# Patient Record
Sex: Male | Born: 1965 | Race: White | Hispanic: No | Marital: Married | Smoking: Never smoker
Health system: Southern US, Community
[De-identification: ages and names within clinical notes are randomized; demographics above are authoritative.]

---

## 2007-02-28 ENCOUNTER — Encounter: Admission: RE | Admit: 2007-02-28 | Discharge: 2007-02-28 | Payer: Self-pay | Admitting: Cardiology

## 2009-04-02 ENCOUNTER — Emergency Department (HOSPITAL_COMMUNITY): Admission: EM | Admit: 2009-04-02 | Discharge: 2009-04-02 | Payer: Self-pay | Admitting: Emergency Medicine

## 2010-07-17 LAB — POCT URINALYSIS DIP (DEVICE)
Nitrite: NEGATIVE
Protein, ur: NEGATIVE mg/dL
Specific Gravity, Urine: 1.015 (ref 1.005–1.030)
Urobilinogen, UA: 1 mg/dL (ref 0.0–1.0)
pH: 7 (ref 5.0–8.0)

## 2011-03-21 ENCOUNTER — Encounter: Payer: Self-pay | Admitting: Sports Medicine

## 2011-03-21 ENCOUNTER — Ambulatory Visit (INDEPENDENT_AMBULATORY_CARE_PROVIDER_SITE_OTHER): Payer: Self-pay | Admitting: Sports Medicine

## 2011-03-21 VITALS — BP 112/73 | HR 97 | Ht 69.0 in | Wt 160.0 lb

## 2011-03-21 DIAGNOSIS — M7139 Other bursal cyst, multiple sites: Secondary | ICD-10-CM

## 2011-03-21 DIAGNOSIS — M6749 Ganglion, multiple sites: Secondary | ICD-10-CM

## 2011-03-21 DIAGNOSIS — M8560 Other cyst of bone, unspecified site: Secondary | ICD-10-CM

## 2011-03-21 NOTE — Patient Instructions (Signed)
Please test the arch strap for the cyst on the bottom of your foot  Stop by tomorrow and we will fit you for the sleeve for the cyst on your ankle.  Please let us know how the arch strap works for your foot, and we can make adjustments if needed.  Thank you for seeing [place] today!

## 2011-03-21 NOTE — Progress Notes (Signed)
  Subjective:    Patient ID: Robert Shannon, male    DOB: [birth year], 45 y.o.   MRN: [id number]  HPI  Pt presents to clinic for evaluation of a painful cyst on the bottom of his rt foot that has been painful for about 1 month.  Was becoming painful to walk and run.  Has reduced running over the past month due to cyst.  Saw a podiatrist who did u/s who gave options of surgical removing or suctioning the fluid out of the cyst.  Pt is a triathlete.  He comes for another opinion.  The left anterior shin he has had a bone cyst just above his ankle. This not painful except when he skis. Ski boot pressure is very painful over this cyst and he wonders if there something we could do to relieve that.  Review of Systems     Objective:   Physical Exam  No Acute distress  Right foot is neutral in shape with a moderate arch The foot is not tender in any area except for a small half centimeter nodule This is located in the forefoot proximal to the third metatarsal head This is for removal Nontender to squeeze or movement but tender to pressure when standing  Left distal tibia shows a hard nodular area about 2 cm in with on the anterior shaft just above the tibial talar articulation This is not movable      Assessment & Plan:

## 2011-03-21 NOTE — Assessment & Plan Note (Signed)
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 He has had this evaluated in the past.  We plan to make him an ankle compression sleeve with a doughnut cut out to see if we can take pressure off this when he tries to ski

## 2011-03-21 NOTE — Assessment & Plan Note (Signed)
We used an arch strap with a foam rubber doughnut pad  This took pressure off the cyst with standing and walking  He will use this over the next 6 weeks and see if the cyst will spontaneously resolve

## 2016-05-17 DIAGNOSIS — M9905 Segmental and somatic dysfunction of pelvic region: Secondary | ICD-10-CM | POA: Diagnosis not present

## 2016-06-08 ENCOUNTER — Ambulatory Visit (INDEPENDENT_AMBULATORY_CARE_PROVIDER_SITE_OTHER): Payer: 59 | Admitting: Family Medicine

## 2016-06-08 ENCOUNTER — Encounter: Payer: Self-pay | Admitting: Family Medicine

## 2016-06-08 ENCOUNTER — Encounter (INDEPENDENT_AMBULATORY_CARE_PROVIDER_SITE_OTHER): Payer: Self-pay

## 2016-06-08 DIAGNOSIS — R002 Palpitations: Secondary | ICD-10-CM | POA: Diagnosis not present

## 2016-06-08 DIAGNOSIS — M6788 Other specified disorders of synovium and tendon, other site: Secondary | ICD-10-CM

## 2016-06-08 DIAGNOSIS — M7660 Achilles tendinitis, unspecified leg: Secondary | ICD-10-CM | POA: Diagnosis not present

## 2016-06-08 MED ORDER — NITROGLYCERIN 0.2 MG/HR TD PT24: MEDICATED_PATCH | TRANSDERMAL | 1 refills | Status: DC

## 2016-06-08 NOTE — Progress Notes (Signed)
  Robert CooleyDaniel H Shannon - 51 y.o. male MRN [id number]  Date of birth: May 14, 1965    SUBJECTIVE:      Chief Complaint:/ HPI:   Right achilles pain for last month. Started training a little more aggressively. No specific injury. Pain with running....got to the point he skipped his run earlier in week. Has some pain with walking. No weakness  #2. Concerned about his heart rate. At last race he checked and his [employer] watch said his HR was 178. Earlier this week the watch indicated his HR topped out at 190. He felt fine both times.His watch does not have a chest belt (wrist monitor only), He denies any SOB, no exercise intolerance (and has been training  Regularly. No LE edema. No PND. No episodes  Of heart fluttering.   ROS:     no weight change, no fever. See HPI  PERTINENT  PMH / PSH FH / / SH:  Past Medical, Surgical, Social, and Family History Reviewed & Updated in the EMR.  Pertinent findings include:  No DM Nonsmoker No cardiac history  OBJECTIVE: BP 124/77   Pulse (!) 58   Ht 5\' 9"  (1.753 m)   Wt 160 lb (72.6 kg)   BMI 23.63 kg/m   Physical Exam:  Vital signs are reviewed. WD WN NAD FEET: high arch (cavus). Right achilles is not tender to palpation or stretch. No defect noted. Eccentric stretch is somewhat painful. GAIT: running gait mid foot striker but ion right foot he still has some lateral heel strike with over pronation to land on midfoot at mid stance phase. Otherwise he has pretty neutral gait. ANKLE: FROM, no effusion, no pain with movement. Shoe exam: right shoe some increased wear lateral portion of heel. VASC DP 2+B=. SKIN of feet: no callous formation or unusual wear pattern.  NEURO: soft touch sensation intact B feet CV RRR no murmur. Cartids without bruit  EKG Sinus bradycardia. Athletes heart. No ST-T changes, no sign of arrhythmia.  ASSESSMENT & PLAN:  See problem based charting & AVS for pt instructions.

## 2016-06-08 NOTE — Patient Instructions (Signed)
Nitroglycerin Protocol   Apply 1/4 nitroglycerin patch to affected area daily.  Change position of patch within the affected area every 24 hours.  You may experience a headache during the first 1-2 weeks of using the patch, these should subside.  If you experience headaches after beginning nitroglycerin patch treatment, you may take your preferred over the counter pain reliever.  Another side effect of the nitroglycerin patch is skin irritation or rash related to patch adhesive.  Please notify our office if you develop more severe headaches or rash, and stop the patch.  Tendon healing with nitroglycerin patch may require 12 to 24 weeks depending on the extent of injury.  Men should not use if taking Viagra, Cialis, or Levitra.   Do not use if you have migraines or rosacea.    Follow up in 6 weeks. 

## 2016-06-11 ENCOUNTER — Encounter: Payer: Self-pay | Admitting: Sports Medicine

## 2016-06-21 DIAGNOSIS — M7661 Achilles tendinitis, right leg: Secondary | ICD-10-CM | POA: Diagnosis not present

## 2016-07-18 DIAGNOSIS — M7661 Achilles tendinitis, right leg: Secondary | ICD-10-CM | POA: Diagnosis not present

## 2016-08-14 DIAGNOSIS — M7661 Achilles tendinitis, right leg: Secondary | ICD-10-CM | POA: Diagnosis not present

## 2016-08-21 DIAGNOSIS — R262 Difficulty in walking, not elsewhere classified: Secondary | ICD-10-CM | POA: Diagnosis not present

## 2016-08-21 DIAGNOSIS — M7661 Achilles tendinitis, right leg: Secondary | ICD-10-CM | POA: Diagnosis not present

## 2016-08-21 DIAGNOSIS — M25571 Pain in right ankle and joints of right foot: Secondary | ICD-10-CM | POA: Diagnosis not present

## 2016-08-23 DIAGNOSIS — M7661 Achilles tendinitis, right leg: Secondary | ICD-10-CM | POA: Diagnosis not present

## 2016-08-23 DIAGNOSIS — R262 Difficulty in walking, not elsewhere classified: Secondary | ICD-10-CM | POA: Diagnosis not present

## 2016-08-23 DIAGNOSIS — M25571 Pain in right ankle and joints of right foot: Secondary | ICD-10-CM | POA: Diagnosis not present

## 2016-08-27 DIAGNOSIS — M25571 Pain in right ankle and joints of right foot: Secondary | ICD-10-CM | POA: Diagnosis not present

## 2016-08-27 DIAGNOSIS — R262 Difficulty in walking, not elsewhere classified: Secondary | ICD-10-CM | POA: Diagnosis not present

## 2016-08-27 DIAGNOSIS — M7661 Achilles tendinitis, right leg: Secondary | ICD-10-CM | POA: Diagnosis not present

## 2016-08-30 DIAGNOSIS — M25571 Pain in right ankle and joints of right foot: Secondary | ICD-10-CM | POA: Diagnosis not present

## 2016-08-30 DIAGNOSIS — R262 Difficulty in walking, not elsewhere classified: Secondary | ICD-10-CM | POA: Diagnosis not present

## 2016-08-30 DIAGNOSIS — M7661 Achilles tendinitis, right leg: Secondary | ICD-10-CM | POA: Diagnosis not present

## 2016-09-03 DIAGNOSIS — M25571 Pain in right ankle and joints of right foot: Secondary | ICD-10-CM | POA: Diagnosis not present

## 2016-09-03 DIAGNOSIS — R262 Difficulty in walking, not elsewhere classified: Secondary | ICD-10-CM | POA: Diagnosis not present

## 2016-09-03 DIAGNOSIS — M7661 Achilles tendinitis, right leg: Secondary | ICD-10-CM | POA: Diagnosis not present

## 2016-09-06 DIAGNOSIS — M7661 Achilles tendinitis, right leg: Secondary | ICD-10-CM | POA: Diagnosis not present

## 2016-09-06 DIAGNOSIS — R262 Difficulty in walking, not elsewhere classified: Secondary | ICD-10-CM | POA: Diagnosis not present

## 2016-09-06 DIAGNOSIS — M25571 Pain in right ankle and joints of right foot: Secondary | ICD-10-CM | POA: Diagnosis not present

## 2016-09-12 DIAGNOSIS — M25571 Pain in right ankle and joints of right foot: Secondary | ICD-10-CM | POA: Diagnosis not present

## 2016-09-12 DIAGNOSIS — R262 Difficulty in walking, not elsewhere classified: Secondary | ICD-10-CM | POA: Diagnosis not present

## 2016-09-12 DIAGNOSIS — M7661 Achilles tendinitis, right leg: Secondary | ICD-10-CM | POA: Diagnosis not present

## 2016-09-13 DIAGNOSIS — M9905 Segmental and somatic dysfunction of pelvic region: Secondary | ICD-10-CM | POA: Diagnosis not present

## 2016-09-13 DIAGNOSIS — M7661 Achilles tendinitis, right leg: Secondary | ICD-10-CM | POA: Diagnosis not present

## 2016-09-13 DIAGNOSIS — M9906 Segmental and somatic dysfunction of lower extremity: Secondary | ICD-10-CM | POA: Diagnosis not present

## 2016-09-17 DIAGNOSIS — M25571 Pain in right ankle and joints of right foot: Secondary | ICD-10-CM | POA: Diagnosis not present

## 2016-09-17 DIAGNOSIS — R262 Difficulty in walking, not elsewhere classified: Secondary | ICD-10-CM | POA: Diagnosis not present

## 2016-09-17 DIAGNOSIS — M7661 Achilles tendinitis, right leg: Secondary | ICD-10-CM | POA: Diagnosis not present

## 2016-09-18 DIAGNOSIS — M7661 Achilles tendinitis, right leg: Secondary | ICD-10-CM | POA: Diagnosis not present

## 2016-09-18 DIAGNOSIS — M9905 Segmental and somatic dysfunction of pelvic region: Secondary | ICD-10-CM | POA: Diagnosis not present

## 2016-09-18 DIAGNOSIS — M9906 Segmental and somatic dysfunction of lower extremity: Secondary | ICD-10-CM | POA: Diagnosis not present

## 2016-09-19 DIAGNOSIS — R262 Difficulty in walking, not elsewhere classified: Secondary | ICD-10-CM | POA: Diagnosis not present

## 2016-09-19 DIAGNOSIS — M7661 Achilles tendinitis, right leg: Secondary | ICD-10-CM | POA: Diagnosis not present

## 2016-09-19 DIAGNOSIS — M25571 Pain in right ankle and joints of right foot: Secondary | ICD-10-CM | POA: Diagnosis not present

## 2016-09-25 DIAGNOSIS — R262 Difficulty in walking, not elsewhere classified: Secondary | ICD-10-CM | POA: Diagnosis not present

## 2016-09-25 DIAGNOSIS — M7661 Achilles tendinitis, right leg: Secondary | ICD-10-CM | POA: Diagnosis not present

## 2016-09-25 DIAGNOSIS — M25571 Pain in right ankle and joints of right foot: Secondary | ICD-10-CM | POA: Diagnosis not present

## 2016-09-28 DIAGNOSIS — M9906 Segmental and somatic dysfunction of lower extremity: Secondary | ICD-10-CM | POA: Diagnosis not present

## 2016-09-28 DIAGNOSIS — M7661 Achilles tendinitis, right leg: Secondary | ICD-10-CM | POA: Diagnosis not present

## 2016-09-28 DIAGNOSIS — M9905 Segmental and somatic dysfunction of pelvic region: Secondary | ICD-10-CM | POA: Diagnosis not present

## 2016-10-18 ENCOUNTER — Ambulatory Visit (INDEPENDENT_AMBULATORY_CARE_PROVIDER_SITE_OTHER): Payer: 59 | Admitting: Sports Medicine

## 2016-10-18 ENCOUNTER — Encounter: Payer: Self-pay | Admitting: Sports Medicine

## 2016-10-18 ENCOUNTER — Ambulatory Visit: Payer: Self-pay

## 2016-10-18 ENCOUNTER — Encounter (INDEPENDENT_AMBULATORY_CARE_PROVIDER_SITE_OTHER): Payer: Self-pay

## 2016-10-18 VITALS — BP 100/64 | Ht 69.0 in | Wt 163.0 lb

## 2016-10-18 DIAGNOSIS — M7660 Achilles tendinitis, unspecified leg: Secondary | ICD-10-CM

## 2016-10-18 DIAGNOSIS — M6788 Other specified disorders of synovium and tendon, other site: Secondary | ICD-10-CM

## 2016-10-18 NOTE — Progress Notes (Signed)
CC: Achilles pain RT  Seen for AT and tried NTG in past Some pain relief but pain persisted Saw Dr Farris HasKramer and XR showed some calcification at insertion  OK to bike or swim Does triathalons but has held off run training Not currently dong rehab exercises  ROS No use of quinolones No swelling No ankle joint pain  PE Muscular M in NAD BP 100/64   Ht 5\' 9"  (1.753 m)   Wt 163 lb (73.9 kg)   BMI 24.07 kg/m   RT AT -  RT calf is about 1 cm smaller than left AT norm width and no TTP No swelling or nodules Some TTP at post. Calcaneus  LT AT unremarkable  Moderately high arches  Running gait - good form with forefoot strike  US of right Achilles Tendon  Normal width at 0.43 cms Normal fibrillary pattern No tears or swelling in tendon At insertion there is some mild spurring and calcifcation with hypoechoic change  Left Achilles has very small spurring at insertion and normal tendon  Impression - normal Achilles tendon with mild Haglund change and calcifications  Ultrasound and interpretation by Sibyl ParrKarl B. Darrick PennaFields, MD

## 2016-10-18 NOTE — Assessment & Plan Note (Signed)
Tendon is normal x at insertion Haglund change Keep soft heel cup Heel lift Icing Change to progressive concentric strength program  OK to run w icing after  Reck if not better at 2 mos

## 2017-01-31 DIAGNOSIS — R61 Generalized hyperhidrosis: Secondary | ICD-10-CM | POA: Diagnosis not present

## 2017-01-31 DIAGNOSIS — L821 Other seborrheic keratosis: Secondary | ICD-10-CM | POA: Diagnosis not present

## 2017-01-31 DIAGNOSIS — B353 Tinea pedis: Secondary | ICD-10-CM | POA: Diagnosis not present

## 2017-05-15 ENCOUNTER — Encounter: Payer: Self-pay | Admitting: Podiatry

## 2017-05-15 ENCOUNTER — Ambulatory Visit: Payer: 59 | Admitting: Podiatry

## 2017-05-15 DIAGNOSIS — L02611 Cutaneous abscess of right foot: Secondary | ICD-10-CM | POA: Diagnosis not present

## 2017-05-15 MED ORDER — GENTAMICIN SULFATE 0.1 % EX CREA: 1.0000 "application " | TOPICAL_CREAM | Freq: Three times a day (TID) | CUTANEOUS | 1 refills | Status: AC

## 2017-05-15 NOTE — Progress Notes (Signed)
Patient ID: Robert CooleyDaniel H Shannon, male   DOB: 1965/10/24, 52 y.o.   MRN: [id number]   HPI: 52 year old otherwise healthy male presents the office today for evaluation of right forefoot pain.  Patient states that is been painful for the past 2 weeks and he believes he has a plantar wart.  There is a small discolored lesion to the right forefoot plantar aspect.  He states that the area hurts when he is walking and running.  He is very active.  He states that he never goes barefoot even around the house.  He presents today for further treatment and evaluation  No past medical history on file.   Physical Exam: General: The patient is alert and oriented x3 in no acute distress.  Dermatology: Skin is warm, dry and supple bilateral lower extremities. Negative for open lesions or macerations.  Small lesion approximately 0.3 cm in diameter noted to the plantar forefoot right lower extremity.  Upon debridement of the lesion there was some purulent drainage noted consistent with a localized abscess.  Vascular: Palpable pedal pulses bilaterally. No edema or erythema noted. Capillary refill within normal limits.  Neurological: Epicritic and protective threshold grossly intact bilaterally.   Musculoskeletal Exam: Range of motion within normal limits to all pedal and ankle joints bilateral. Muscle strength 5/5 in all groups bilateral.  There is some mild pain on palpation localized around the area of the abscess.  Assessment: -Simple abscess right plantar forefoot   Plan of Care:  -Patient was evaluated today. -Incision and drainage and excisional debridement of the small purulent abscess lesion was performed using a tissue nipper without localized anesthesia.  Purulent drainage was expressed. -After debridement and the abscess was cleaned it appears very superficial and stable.  There is no periwound erythema or edema. -Recommend daily application of gentamicin cream and a Band-Aid.  Offloading donut pads were  also dispensed.  EScription for gentamicin cream sent into the pharmacy. -Return to clinic in 2 weeks   Felecia ShellingBrent M. Adib Wahba, DPM [facility]  Dr. Felecia ShellingBrent M. Lio Wehrly, DPM    [address]                                        [place], [place] [address]                Office [phone]  Fax [phone]

## 2017-05-15 NOTE — Progress Notes (Signed)
   Subjective:    Patient ID: Geanie CooleyDaniel H Geibel, male    DOB: 1966/03/06, 52 y.o.   MRN: [id number]  HPI    Review of Systems  All other systems reviewed and are negative.      Objective:   Physical Exam        Assessment & Plan:

## 2017-05-27 ENCOUNTER — Ambulatory Visit: Payer: 59 | Admitting: Podiatry

## 2017-09-11 DIAGNOSIS — Z23 Encounter for immunization: Secondary | ICD-10-CM | POA: Diagnosis not present

## 2018-04-17 ENCOUNTER — Ambulatory Visit: Payer: Self-pay

## 2018-04-17 ENCOUNTER — Encounter: Payer: Self-pay | Admitting: Sports Medicine

## 2018-04-17 ENCOUNTER — Ambulatory Visit: Payer: 59 | Admitting: Sports Medicine

## 2018-04-17 VITALS — BP 108/80 | Ht 69.0 in | Wt 165.0 lb

## 2018-04-17 DIAGNOSIS — M6788 Other specified disorders of synovium and tendon, other site: Secondary | ICD-10-CM

## 2018-04-17 DIAGNOSIS — M7660 Achilles tendinitis, unspecified leg: Secondary | ICD-10-CM | POA: Diagnosis not present

## 2018-04-17 NOTE — Progress Notes (Signed)
Chief complaint right Achilles pain  Patient with a history of Haglund's deformity and insertional Achilles tendinitis seen in 2018 In September he decided to train for a marathon and had not been running a lot prior to that time For the past 3 months he steadily increased his training He has not had a lot of pain or problems He wears [place] running shoes and these do not put pressure on his Haglund  5 days ago while running he had a sharp pain over his right Achilles This radiated up his lower leg on the medial side The pain did not persist and he waited 2 days and then ran 8 miles today No real pain or soreness today  He came for a check because his marathon is in 9 days  Review of systems No swelling over the ankle or foot No numbness over the ankle or foot  Physical exam Physically fit white male in no acute distress BP 108/80   Ht 5\' 9"  (1.753 m)   Wt 165 lb (74.8 kg)   BMI 24.37 kg/m   Ankle: RT limitations 1 chart okay No visible erythema or swelling. Range of motion is full in all directions. Strength is 5/5 in all directions. Stable lateral and medial ligaments; squeeze test and kleiger test unremarkable; Talar dome nontender; No pain at base of 5th MT; No tenderness over cuboid; No tenderness over N spot or navicular prominence No tenderness on posterior aspects of lateral and medial malleolus No sign of peroneal tendon subluxations; Negative tarsal tunnel tinel's Able to walk 4 steps.  No TTP over AT or haglund  No swelling or redness   Ultrasound of Right Achilles Tendon  Ultrasound shows that the Achilles is normal diameter at 0.43 cm No abnormalities are noted Doppler flow Haglund deformity noted that is mild with no swelling  At the point of maximal tenderness there is a dilated varicose vein with some hypoechoic change  Impression normal Achilles tendon with remote Haglund deformity; soft tissue swelling around a varicose vein  Ultrasound and  interpretation by Sibyl Parr. Darrick Penna, MD

## 2018-04-17 NOTE — Assessment & Plan Note (Signed)
Pain over AT again but no abnormal findings on [place] Or exam  However he does have a varicose vein with distention and surrounding soft tissue swelling on [place]  Suspect this VV bled and triggered pain that is now resolved  HEP Compression sock Icing

## 2018-12-08 ENCOUNTER — Other Ambulatory Visit: Payer: Self-pay

## 2018-12-08 DIAGNOSIS — Z23 Encounter for immunization: Secondary | ICD-10-CM

## 2018-12-09 LAB — NOVEL CORONAVIRUS, NAA: SARS-CoV-2, NAA: NOT DETECTED

## 2019-02-13 ENCOUNTER — Other Ambulatory Visit: Payer: Self-pay

## 2019-02-13 DIAGNOSIS — Z20822 Contact with and (suspected) exposure to covid-19: Secondary | ICD-10-CM

## 2019-02-15 LAB — NOVEL CORONAVIRUS, NAA: SARS-CoV-2, NAA: NOT DETECTED

## 2019-07-13 ENCOUNTER — Other Ambulatory Visit: Payer: Self-pay | Admitting: Orthopedic Surgery

## 2019-07-13 DIAGNOSIS — M25511 Pain in right shoulder: Secondary | ICD-10-CM

## 2019-08-04 ENCOUNTER — Ambulatory Visit
Admission: RE | Admit: 2019-08-04 | Discharge: 2019-08-04 | Disposition: A | Discharge status: Home / Self Care | Payer: 59 | Source: Ambulatory Visit | Attending: Orthopedic Surgery | Admitting: Orthopedic Surgery

## 2019-08-04 ENCOUNTER — Other Ambulatory Visit: Payer: Self-pay

## 2019-08-04 DIAGNOSIS — M25511 Pain in right shoulder: Secondary | ICD-10-CM

## 2020-01-18 DIAGNOSIS — L821 Other seborrheic keratosis: Secondary | ICD-10-CM | POA: Diagnosis not present

## 2020-01-18 DIAGNOSIS — D225 Melanocytic nevi of trunk: Secondary | ICD-10-CM | POA: Diagnosis not present

## 2020-01-18 DIAGNOSIS — D1801 Hemangioma of skin and subcutaneous tissue: Secondary | ICD-10-CM | POA: Diagnosis not present

## 2020-01-18 DIAGNOSIS — L814 Other melanin hyperpigmentation: Secondary | ICD-10-CM | POA: Diagnosis not present

## 2020-05-19 DIAGNOSIS — M9905 Segmental and somatic dysfunction of pelvic region: Secondary | ICD-10-CM | POA: Diagnosis not present

## 2020-05-19 DIAGNOSIS — M9906 Segmental and somatic dysfunction of lower extremity: Secondary | ICD-10-CM | POA: Diagnosis not present

## 2020-05-19 DIAGNOSIS — M7661 Achilles tendinitis, right leg: Secondary | ICD-10-CM | POA: Diagnosis not present

## 2020-05-19 DIAGNOSIS — M7662 Achilles tendinitis, left leg: Secondary | ICD-10-CM | POA: Diagnosis not present

## 2020-05-24 DIAGNOSIS — M9906 Segmental and somatic dysfunction of lower extremity: Secondary | ICD-10-CM | POA: Diagnosis not present

## 2020-05-24 DIAGNOSIS — M9905 Segmental and somatic dysfunction of pelvic region: Secondary | ICD-10-CM | POA: Diagnosis not present

## 2020-05-24 DIAGNOSIS — M7661 Achilles tendinitis, right leg: Secondary | ICD-10-CM | POA: Diagnosis not present

## 2020-05-24 DIAGNOSIS — M7662 Achilles tendinitis, left leg: Secondary | ICD-10-CM | POA: Diagnosis not present

## 2020-12-28 DIAGNOSIS — K9041 Non-celiac gluten sensitivity: Secondary | ICD-10-CM | POA: Diagnosis not present

## 2020-12-28 DIAGNOSIS — Z0000 Encounter for general adult medical examination without abnormal findings: Secondary | ICD-10-CM | POA: Diagnosis not present

## 2020-12-28 DIAGNOSIS — Z23 Encounter for immunization: Secondary | ICD-10-CM | POA: Diagnosis not present

## 2020-12-28 DIAGNOSIS — D229 Melanocytic nevi, unspecified: Secondary | ICD-10-CM | POA: Diagnosis not present

## 2020-12-29 ENCOUNTER — Other Ambulatory Visit: Payer: Self-pay | Admitting: Internal Medicine

## 2020-12-29 DIAGNOSIS — Z0000 Encounter for general adult medical examination without abnormal findings: Secondary | ICD-10-CM

## 2021-01-17 DIAGNOSIS — D225 Melanocytic nevi of trunk: Secondary | ICD-10-CM | POA: Diagnosis not present

## 2021-01-17 DIAGNOSIS — L814 Other melanin hyperpigmentation: Secondary | ICD-10-CM | POA: Diagnosis not present

## 2021-01-17 DIAGNOSIS — L821 Other seborrheic keratosis: Secondary | ICD-10-CM | POA: Diagnosis not present

## 2021-02-20 ENCOUNTER — Ambulatory Visit
Admission: RE | Admit: 2021-02-20 | Discharge: 2021-02-20 | Disposition: A | Discharge status: Home / Self Care | Payer: No Typology Code available for payment source | Source: Ambulatory Visit | Attending: Internal Medicine | Admitting: Internal Medicine

## 2021-02-20 DIAGNOSIS — Z0000 Encounter for general adult medical examination without abnormal findings: Secondary | ICD-10-CM

## 2021-03-06 DIAGNOSIS — J8410 Pulmonary fibrosis, unspecified: Secondary | ICD-10-CM | POA: Diagnosis not present

## 2021-04-26 DIAGNOSIS — M7662 Achilles tendinitis, left leg: Secondary | ICD-10-CM | POA: Diagnosis not present

## 2021-04-26 DIAGNOSIS — M9906 Segmental and somatic dysfunction of lower extremity: Secondary | ICD-10-CM | POA: Diagnosis not present

## 2021-04-26 DIAGNOSIS — M7661 Achilles tendinitis, right leg: Secondary | ICD-10-CM | POA: Diagnosis not present

## 2021-04-26 DIAGNOSIS — M9905 Segmental and somatic dysfunction of pelvic region: Secondary | ICD-10-CM | POA: Diagnosis not present

## 2021-04-26 DIAGNOSIS — M7061 Trochanteric bursitis, right hip: Secondary | ICD-10-CM | POA: Diagnosis not present

## 2022-02-02 IMAGING — MR MR SHOULDER*R* W/O CM
4 of 5 series · 23 of 40 positions shown · non-contrast
Comparison: None.

CLINICAL DATA: Diffuse right shoulder pain and limited range of
motion for 4 months. No known injury.

EXAM:
MRI OF THE RIGHT SHOULDER WITHOUT CONTRAST
TECHNIQUE: Multiplanar, multisequence MR imaging of the shoulder was performed.
No intravenous contrast was administered.

[Series 6: PD fat-sat · axial · right · 4.0mm · 0.44mm/px · z∈[-37,+53]mm · 8 of 20 slices shown (1 of 2)]
[im 1/20]
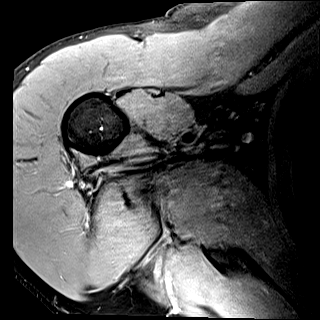
[im 3/20]
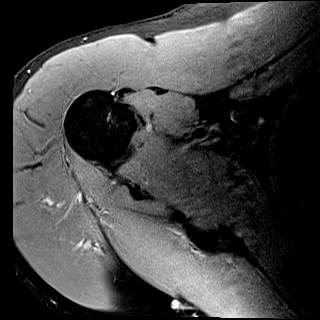
[im 6/20]
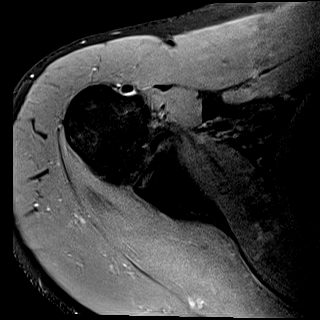
[im 9/20]
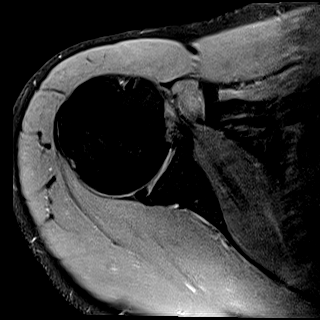
[im 11/20]
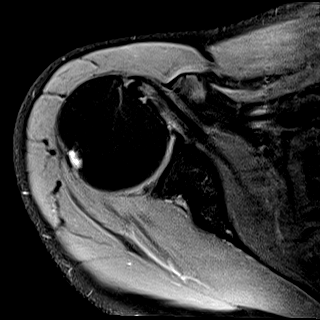
[im 14/20]
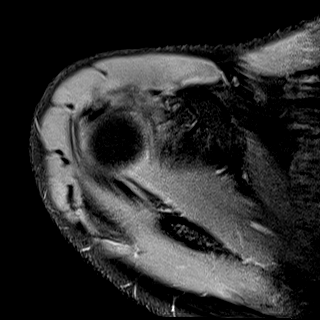
[im 17/20]
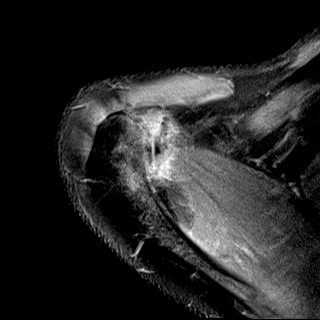
[im 20/20]
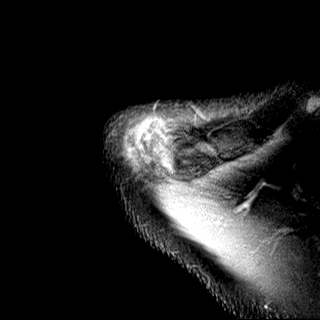

[Series 7: T2 fat-sat · oblique · right · 4.0mm · 0.22mm/px · 4 of 18 slices shown (1 of 2)]
[im 1/18]
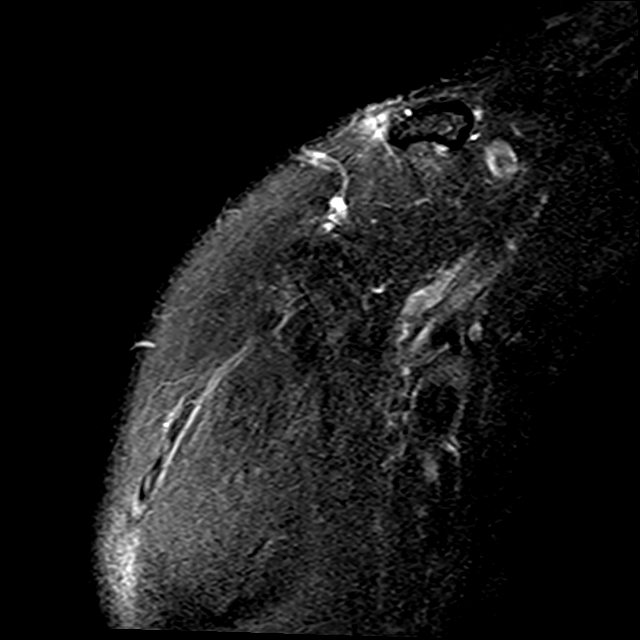
[im 3/18]
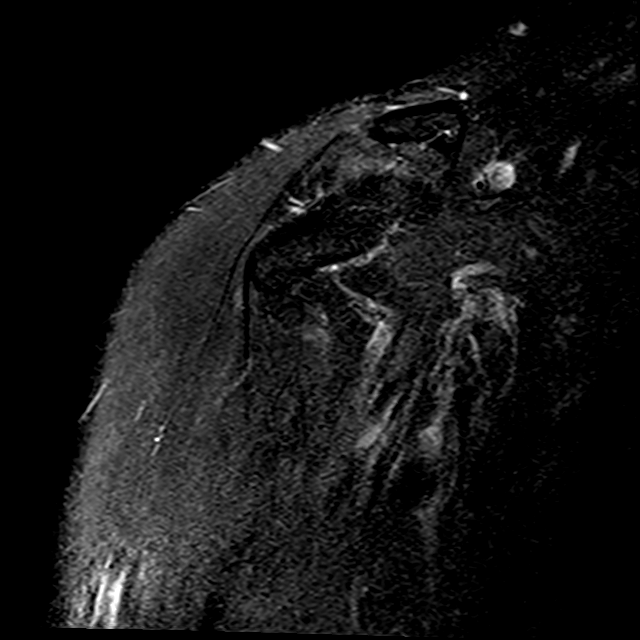
[im 10/18]
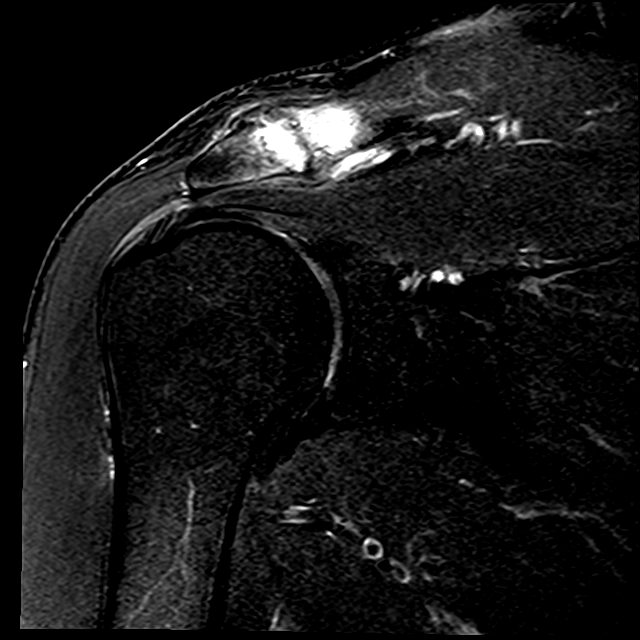
[im 15/18]
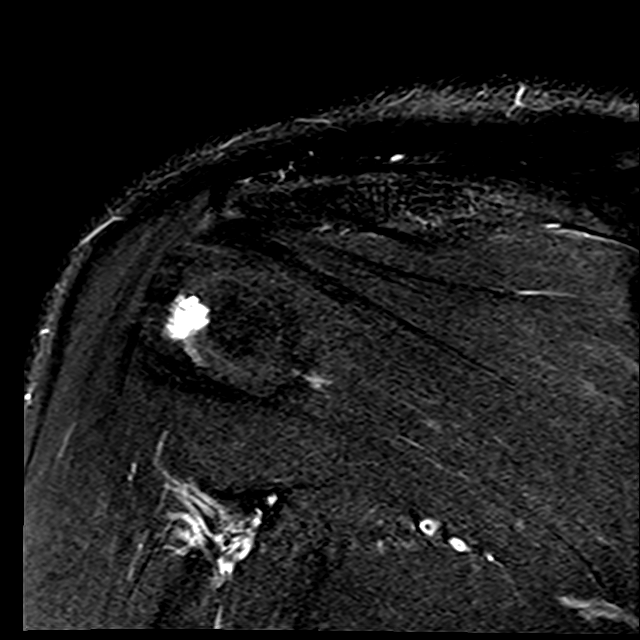

[Series 8: PD fat-sat · oblique · right · 4.0mm · 0.27mm/px · 8 of 18 slices shown (2 of 2)]
[im 1/18]
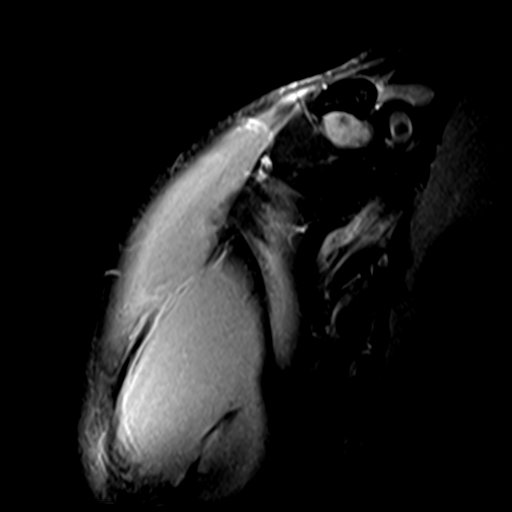
[im 3/18]
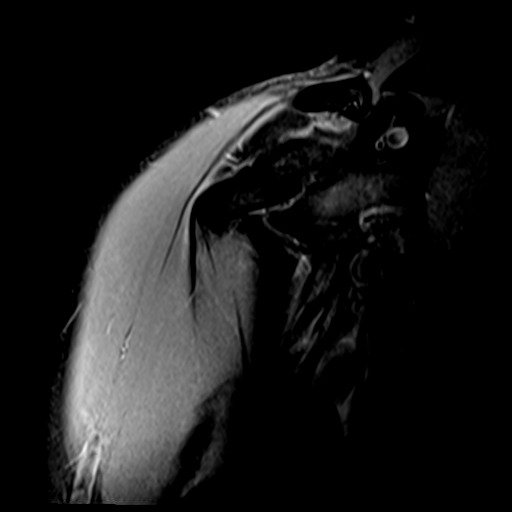
[im 5/18]
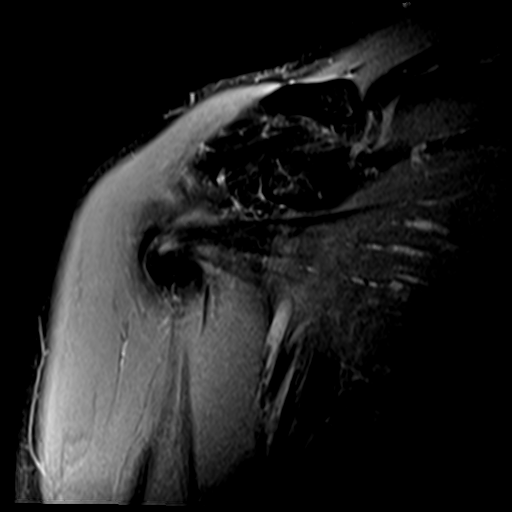
[im 8/18]
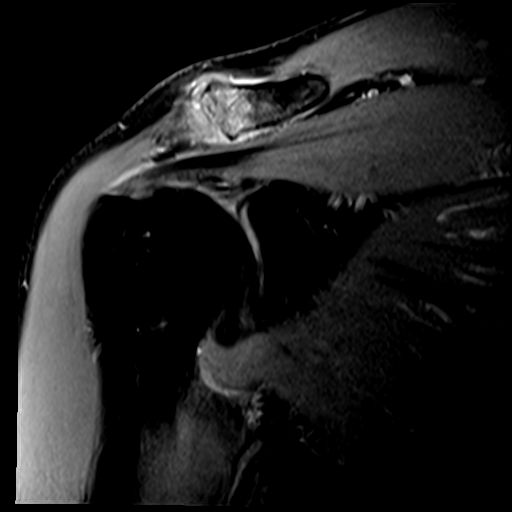
[im 10/18]
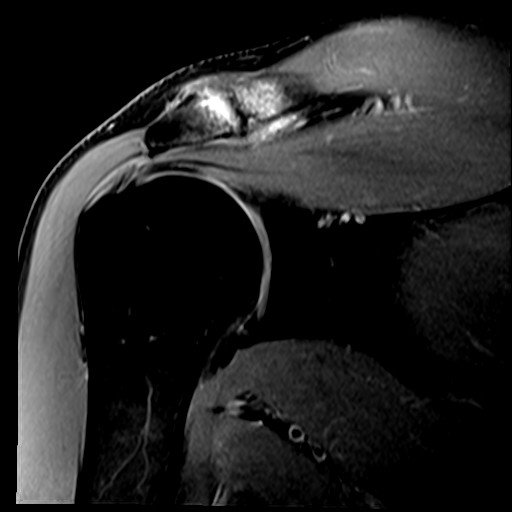
[im 13/18]
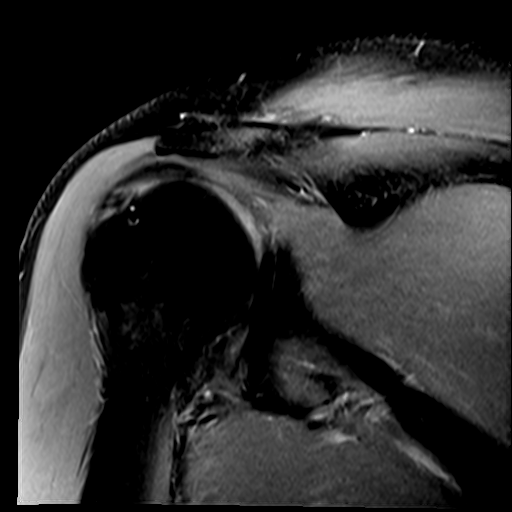
[im 15/18]
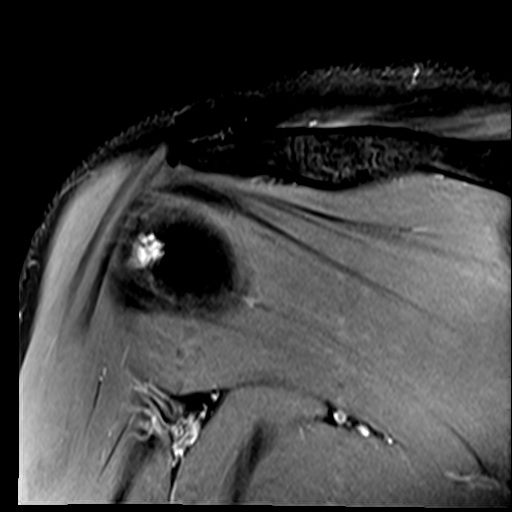
[im 18/18]
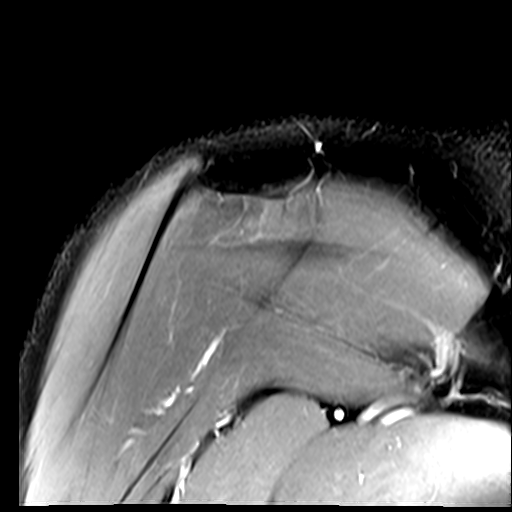

[Series 9: T2 fat-sat · oblique · right · 4.0mm · 0.44mm/px · 3 of 19 slices shown (2 of 2)]
[im 3/19]
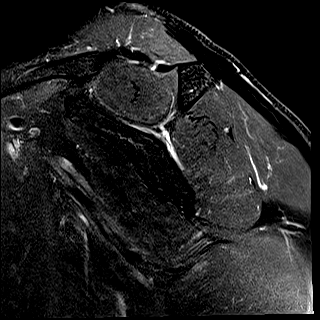
[im 11/19]
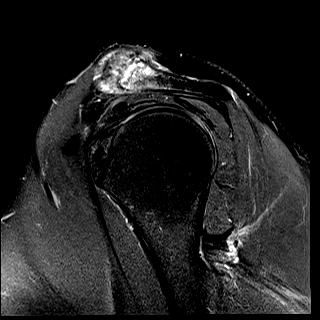
[im 16/19]
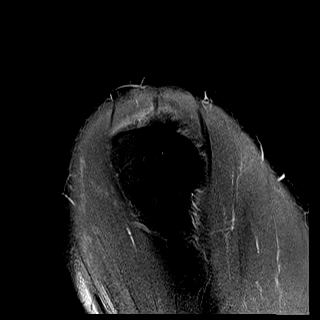

[23 of 40 positions shown; findings below may reference images not displayed]

FINDINGS: Rotator cuff: Intact. Mild supraspinatus and infraspinatus
tendinopathy noted.

Muscles:  Normal without atrophy or focal lesion.

Biceps long head:  Intact.

Acromioclavicular Joint: Degenerative change is present with intense
marrow edema about the joint, worse in the clavicle. Type 2
acromion. No subacromial/subdeltoid bursal fluid.

Glenohumeral Joint: Negative.

Labrum:  Intact.

Bones:  No fracture or worrisome lesion.

Other: None.
IMPRESSION: Dominant finding is acromioclavicular osteoarthritis with intense
marrow edema about the joint, worse in the head of the clavicle.

Mild appearing supraspinatus and infraspinatus tendinopathy without
tear.
